# Patient Record
Sex: Female | Born: 1965 | Race: White | Hispanic: No | Marital: Single | Smoking: Never smoker
Health system: Southern US, Community
[De-identification: ages and names within clinical notes are randomized; demographics above are authoritative.]

## PROBLEM LIST (undated history)

## (undated) DIAGNOSIS — E785 Hyperlipidemia, unspecified: Secondary | ICD-10-CM

## (undated) DIAGNOSIS — F329 Major depressive disorder, single episode, unspecified: Secondary | ICD-10-CM

## (undated) DIAGNOSIS — F32A Depression, unspecified: Secondary | ICD-10-CM

## (undated) HISTORY — DX: Major depressive disorder, single episode, unspecified: F32.9

## (undated) HISTORY — DX: Hyperlipidemia, unspecified: E78.5

## (undated) HISTORY — DX: Depression, unspecified: F32.A

---

## 2006-08-26 ENCOUNTER — Ambulatory Visit: Payer: Self-pay | Admitting: Family Medicine

## 2014-09-23 ENCOUNTER — Ambulatory Visit: Payer: Self-pay | Admitting: Family Medicine

## 2015-01-18 ENCOUNTER — Telehealth: Payer: Self-pay | Admitting: Family Medicine

## 2015-01-18 NOTE — Telephone Encounter (Signed)
Michaela Brown from [facility] is requesting a copy of the authorization with the start date.please fax to [phone] if you have any questions you could reach her at [phone]. Her appointment is scheduled for 01-24-15 @ 11a

## 2015-01-20 NOTE — Telephone Encounter (Signed)
Spoke with Marlowe Kays from Dr. Blane Ohara office and gave her the [facility] referral number [phone]) for patient's upcoming appointment.

## 2015-01-24 DIAGNOSIS — M129 Arthropathy, unspecified: Secondary | ICD-10-CM | POA: Insufficient documentation

## 2015-01-24 DIAGNOSIS — K529 Noninfective gastroenteritis and colitis, unspecified: Secondary | ICD-10-CM | POA: Insufficient documentation

## 2015-06-23 ENCOUNTER — Encounter: Payer: Self-pay | Admitting: Family Medicine

## 2015-06-23 ENCOUNTER — Ambulatory Visit (INDEPENDENT_AMBULATORY_CARE_PROVIDER_SITE_OTHER): Payer: 59 | Admitting: Family Medicine

## 2015-06-23 VITALS — BP 102/66 | HR 88 | Temp 98.2°F | Resp 16 | Ht 62.0 in | Wt 173.5 lb

## 2015-06-23 DIAGNOSIS — M25562 Pain in left knee: Secondary | ICD-10-CM | POA: Diagnosis not present

## 2015-06-23 DIAGNOSIS — F418 Other specified anxiety disorders: Principal | ICD-10-CM

## 2015-06-23 DIAGNOSIS — M25561 Pain in right knee: Secondary | ICD-10-CM

## 2015-06-23 DIAGNOSIS — F3341 Major depressive disorder, recurrent, in partial remission: Secondary | ICD-10-CM

## 2015-06-23 DIAGNOSIS — F329 Major depressive disorder, single episode, unspecified: Secondary | ICD-10-CM | POA: Insufficient documentation

## 2015-06-23 DIAGNOSIS — R4586 Emotional lability: Secondary | ICD-10-CM | POA: Insufficient documentation

## 2015-06-23 DIAGNOSIS — R5383 Other fatigue: Secondary | ICD-10-CM | POA: Insufficient documentation

## 2015-06-23 DIAGNOSIS — E785 Hyperlipidemia, unspecified: Secondary | ICD-10-CM | POA: Insufficient documentation

## 2015-06-23 DIAGNOSIS — K640 First degree hemorrhoids: Secondary | ICD-10-CM | POA: Insufficient documentation

## 2015-06-23 DIAGNOSIS — M2550 Pain in unspecified joint: Secondary | ICD-10-CM | POA: Insufficient documentation

## 2015-06-23 MED ORDER — CITALOPRAM HYDROBROMIDE 10 MG PO TABS: 10.0000 mg | ORAL_TABLET | Freq: Every day | ORAL | Status: DC

## 2015-06-23 MED ORDER — CLONAZEPAM 0.5 MG PO TABS: 0.5000 mg | ORAL_TABLET | Freq: Two times a day (BID) | ORAL | Status: DC | PRN

## 2015-06-23 NOTE — Progress Notes (Signed)
Name: Michaela Brown   MRN: [id number]    DOB: 06-20-1966   Date:06/23/2015       Progress Note  Subjective  Chief Complaint  Chief Complaint  Patient presents with  . Advice Only  . Anxiety  . Depression    HPI  Michaela Brown is a 49 year old female here today to discuss her caretaker role for her brother with Neuroferritinopathy. He has been living with her for just about 1 year now and it has become overwhelming at times. 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 She has brought forms today. She reports concerns that he is often found in difficult situations around the home that could be potentially dangerous. Examples include trying to turn on the stove, even though he does not prepare meals. Calling his emergency life alert and when asked why he says "I do not know." He does not have dementia but does have lapses in immediate memory it seems. He is not violent or combative. He can bathe, feed, dress himself with assistance. Still has trouble with gait but holds onto things and walks.   Otherwise she reports mood changes in herself. Patient complains of depression and anxiety. She complains of excessive worrying, depressed mood, difficulty concentrating, fatigue and psychomotor agitation. Onset was approximately several months ago, stable since that time.  She denies current suicidal and homicidal plan or intent.   Family history significant for depression. Possible organic causes contributing are: none.  Risk factors: none Previous treatment includes none. Daci states she hides it well. When she is performing with her dogs at shows and events she reports having to be happy but when done she reports feeling low. She is dating a younger man, feels self conscious about her weight.   Previously she had generalized joint pain and extreme fatigue. Initially thought to be post lyme syndrome,  consulted with ID, this diagnosis ruled out. The specialist suggests she may have early manifestations of an autoimmune disorder. Today she reports all of her symptoms have resolved. Back to baseline management intermittent knee pain.   Past Medical History  Diagnosis Date  . Hyperlipidemia   . Depression     Patient Active Problem List   Diagnosis Date Noted  . Multiple joint pain 06/23/2015  . Bilateral knee pain 06/23/2015  . Changeable mood ([place]) 06/23/2015  . Fatigue 06/23/2015  . Hyperlipidemia LDL goal <100 06/23/2015  . First degree hemorrhoids 06/23/2015  . Arthropathia 01/24/2015    Social History  Substance Use Topics  . Smoking status: Never Smoker   . Smokeless tobacco: Not on file  . Alcohol Use: 0.0 oz/week    0 Standard drinks or equivalent per week     Comment: rarely     Current outpatient prescriptions:  Marland Kitchen  Multiple Vitamins-Minerals (WOMENS MULTIVITAMIN PLUS PO), Take by mouth., Disp: , Rfl:  .  vitamin C (ASCORBIC ACID) 500 MG tablet, Take 500 mg by mouth daily., Disp: , Rfl:   History reviewed. No pertinent past surgical history.  Family History  Problem Relation Age of Onset  . Neurologic Disorder Brother   . Depression Brother     Allergies  Allergen Reactions  . Azithromycin Tinitus     Review of Systems  CONSTITUTIONAL: No significant weight changes, fever, chills, weakness or fatigue.  CARDIOVASCULAR: No chest pain, chest pressure or chest discomfort. No palpitations or edema.  RESPIRATORY: No shortness of breath, cough or sputum.  NEUROLOGICAL: No headache, dizziness, syncope, paralysis, ataxia, numbness or tingling in the extremities. No memory changes. No change in bowel or bladder control.  MUSCULOSKELETAL: Chronic joint pain. No muscle pain. HEMATOLOGIC: No anemia, bleeding or bruising.  LYMPHATICS: No enlarged lymph nodes.  PSYCHIATRIC: Yes change in mood. No change in sleep pattern.  ENDOCRINOLOGIC: No reports of sweating,  cold or heat intolerance. No polyuria or polydipsia.   Depression screen [facility] 2/9 06/23/2015  Decreased Interest 1  Down, Depressed, Hopeless 3  PHQ - 2 Score 4  Altered sleeping 3  Tired, decreased energy 3  Change in appetite 0  Feeling bad or failure about yourself  3  Trouble concentrating 3  Moving slowly or fidgety/restless 0  Suicidal thoughts 1  PHQ-9 Score 17      Objective  Pulse 88  Temp(Src) 98.2 F (36.8 C) (Oral)  Resp 16  Ht 5\' 2"  (1.575 m)  Wt 173 lb 8 oz (78.699 kg)  BMI 31.73 kg/m2  SpO2 98% Body mass index is 31.73 kg/(m^2).  Physical Exam  Constitutional: Patient appears well-developed and well-nourished. In no distress.  Cardiovascular: Normal rate, regular rhythm and normal heart sounds.  No murmur heard.  Pulmonary/Chest: Effort normal and breath sounds normal. No respiratory distress. Musculoskeletal: Normal range of motion bilateral UE and LE, no joint effusions. Peripheral vascular: Bilateral LE no edema. Neurological: CN II-XII grossly intact with no focal deficits. Alert and oriented to person, place, and time. Coordination, balance, strength, speech and gait are normal.  Skin: Skin is warm and dry. No rash noted. No erythema.  Psychiatric: Patient has a tearful mood and affect. Behavior is normal in office today. Judgment and thought content normal in office today.   Assessment & Plan  1. Major depressive disorder, recurrent episode, in partial remission with anxious distress ([place]) New diagnosis. Discussed treatment options. Due to nature of symptoms with some mixed anxiety components will start with Celexa low dose with prn use of benzodiazepine. The patient has been counseled on the proper use, side effects and potential interactions of the new medication. Patient encouraged to review the side effects and safety profile pamphlet provided with the prescription from the pharmacy as well as request counseling from the pharmacy team as needed.    - citalopram (CELEXA) 10 MG tablet; Take 1 tablet (10 mg total) by mouth daily.  Dispense: 30 tablet; Refill: 2 - clonazePAM (KLONOPIN) 0.5 MG tablet; Take 1 tablet (0.5 mg total) by mouth 2 (two) times daily as needed for anxiety.  Dispense: 30 tablet; Refill: 2  2. Bilateral knee pain Stable.

## 2015-09-28 ENCOUNTER — Other Ambulatory Visit: Payer: Self-pay | Admitting: Family Medicine

## 2015-11-12 IMAGING — CR DG KNEE COMPLETE 4+V*R*
1 series · 4 of 4 positions shown · non-contrast
Comparison: None.

CLINICAL DATA: Right knee pain, no known injury, initial encounter

EXAM:
RIGHT KNEE - COMPLETE 4+ VIEW

[Series 1: kdxr knee rt comp with obliques · 0.14mm/px · 4 of 4 slices shown]
[im 1/4]
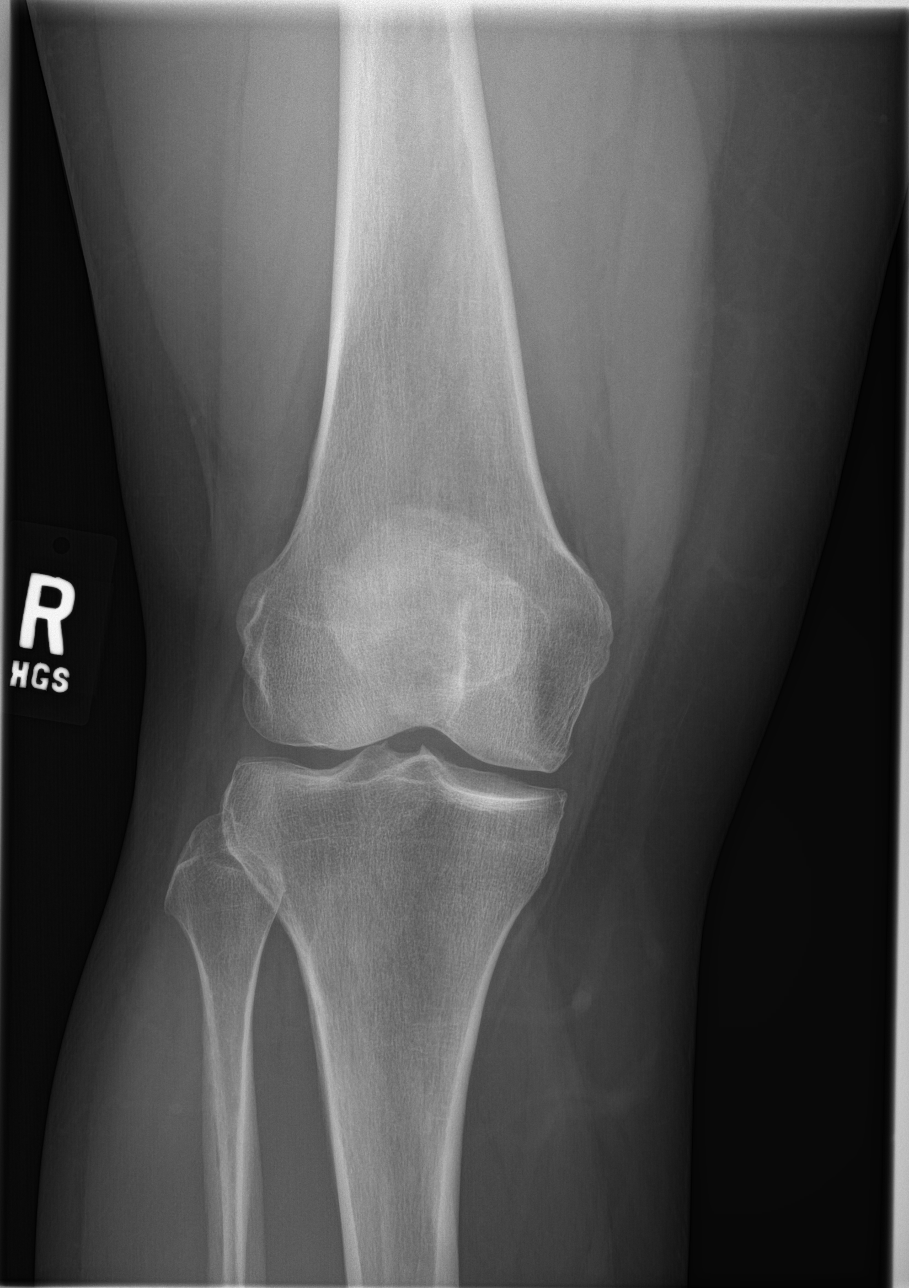
[im 2/4]
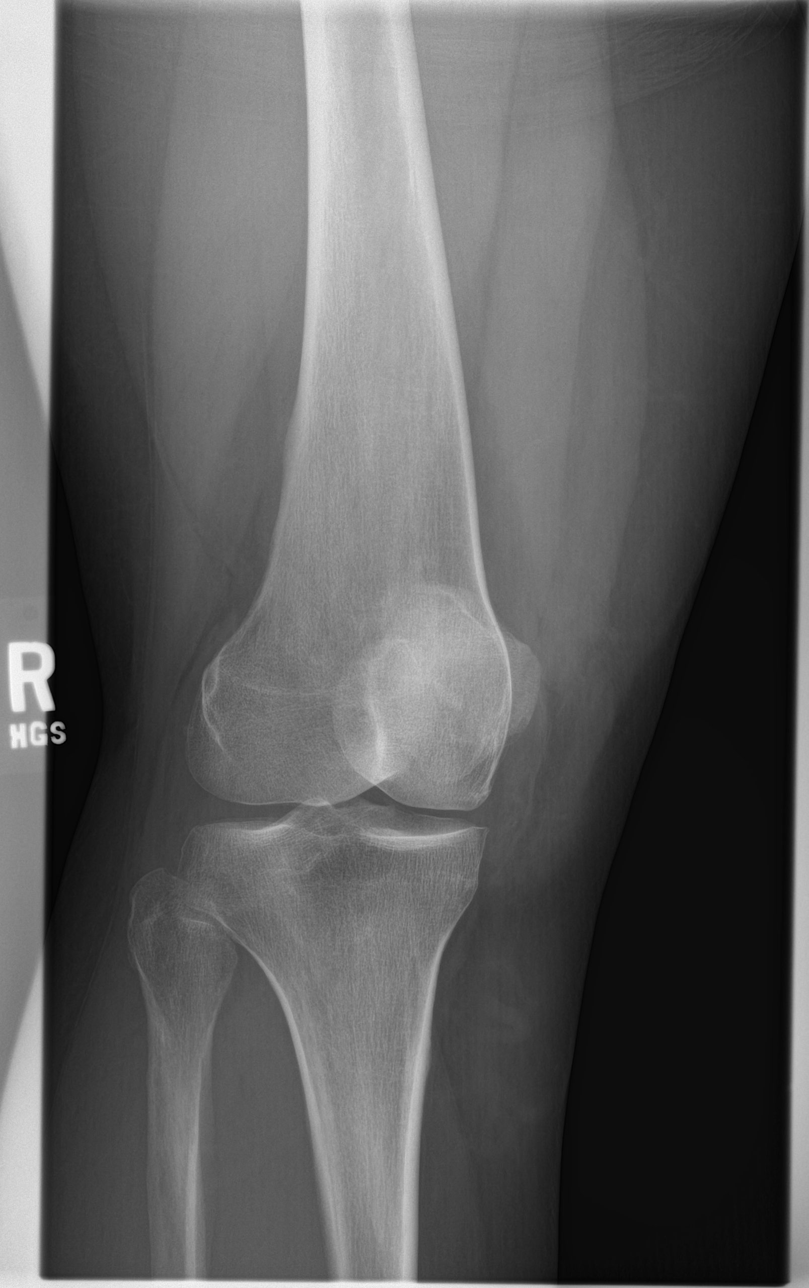
[im 3/4]
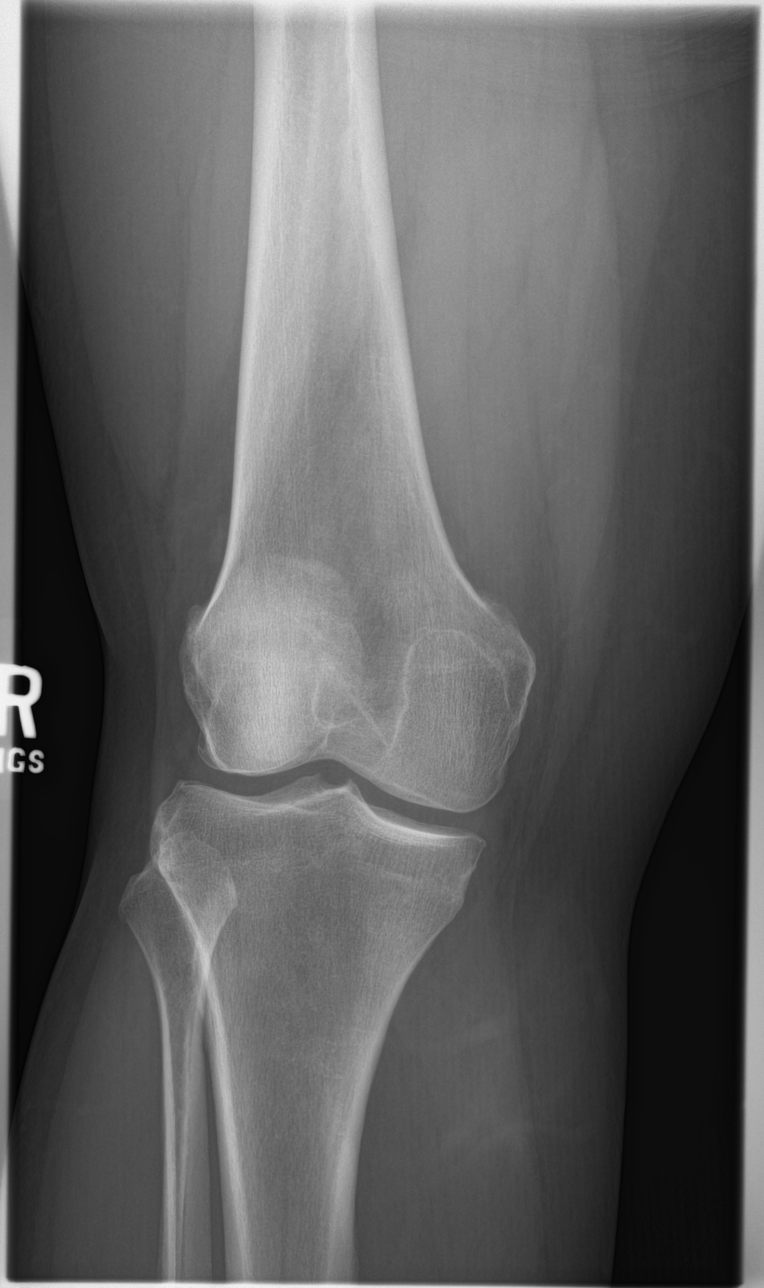
[im 4/4]
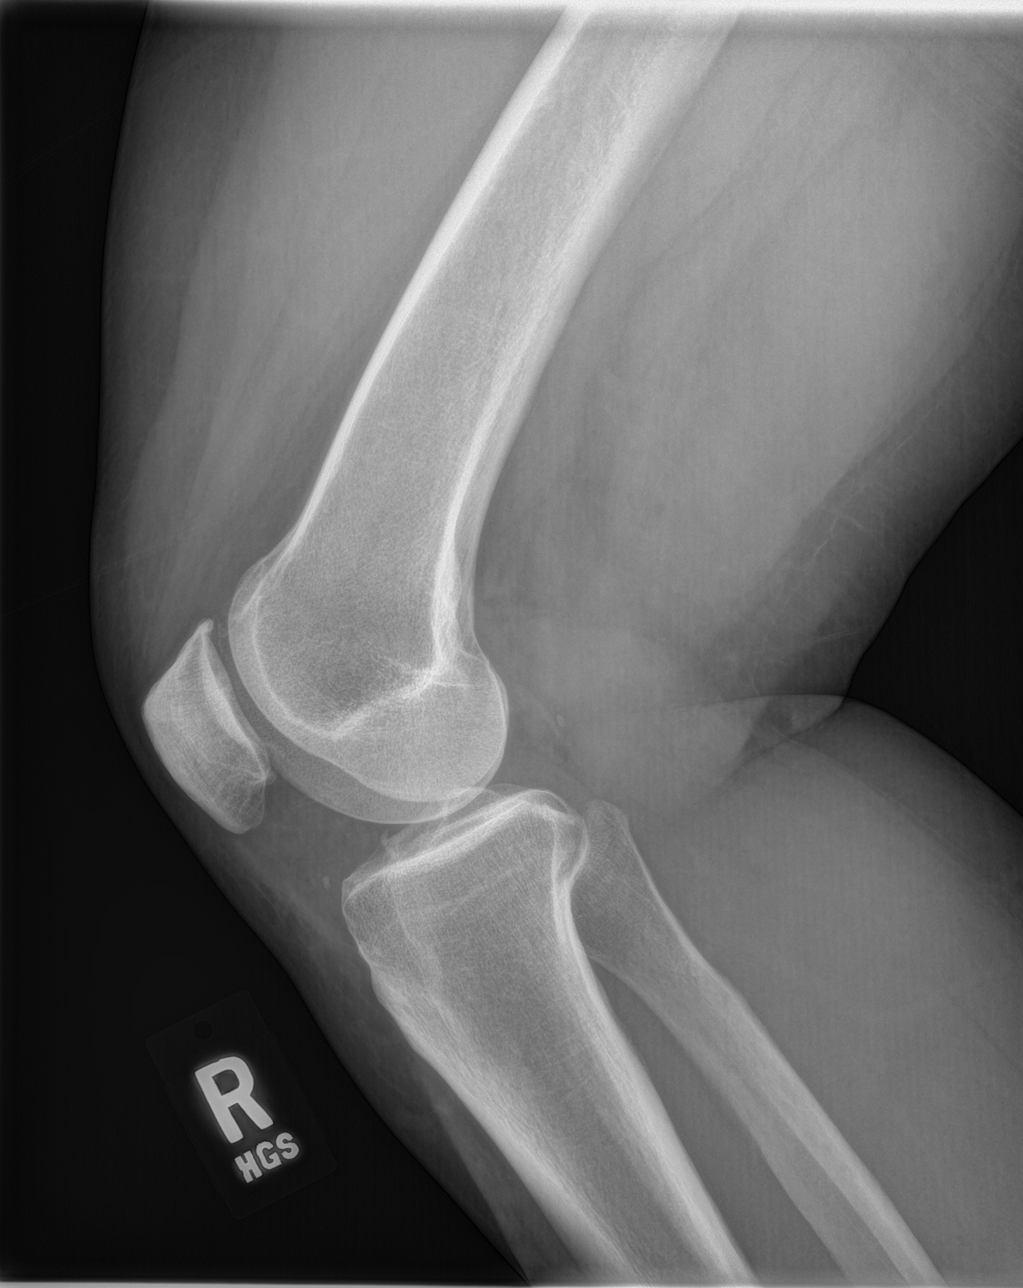

[4 of 4 positions shown; findings below may reference images not displayed]

FINDINGS: Mild osteophytic changes are noted medially and laterally. No
significant joint effusion is seen. No significant joint space
narrowing is seen. No acute fracture or dislocation is noted.
IMPRESSION: Mild osteophytic changes.  No acute abnormality is noted.

## 2015-12-25 ENCOUNTER — Encounter: Payer: Self-pay | Admitting: Family Medicine

## 2015-12-25 ENCOUNTER — Ambulatory Visit (INDEPENDENT_AMBULATORY_CARE_PROVIDER_SITE_OTHER): Payer: BLUE CROSS/BLUE SHIELD | Admitting: Family Medicine

## 2015-12-25 VITALS — BP 122/62 | Temp 98.3°F | Resp 16 | Wt 179.0 lb

## 2015-12-25 DIAGNOSIS — E785 Hyperlipidemia, unspecified: Secondary | ICD-10-CM

## 2015-12-25 DIAGNOSIS — F329 Major depressive disorder, single episode, unspecified: Secondary | ICD-10-CM

## 2015-12-25 DIAGNOSIS — R5383 Other fatigue: Secondary | ICD-10-CM

## 2015-12-25 DIAGNOSIS — R635 Abnormal weight gain: Secondary | ICD-10-CM | POA: Diagnosis not present

## 2015-12-25 DIAGNOSIS — K921 Melena: Secondary | ICD-10-CM | POA: Diagnosis not present

## 2015-12-25 DIAGNOSIS — Z0000 Encounter for general adult medical examination without abnormal findings: Secondary | ICD-10-CM

## 2015-12-25 DIAGNOSIS — Z113 Encounter for screening for infections with a predominantly sexual mode of transmission: Secondary | ICD-10-CM

## 2015-12-25 DIAGNOSIS — IMO0001 Reserved for inherently not codable concepts without codable children: Secondary | ICD-10-CM

## 2015-12-25 DIAGNOSIS — N950 Postmenopausal bleeding: Secondary | ICD-10-CM

## 2015-12-25 DIAGNOSIS — Z1239 Encounter for other screening for malignant neoplasm of breast: Secondary | ICD-10-CM

## 2015-12-25 DIAGNOSIS — N930 Postcoital and contact bleeding: Secondary | ICD-10-CM

## 2015-12-25 MED ORDER — FLUOXETINE HCL 20 MG PO CAPS: 20.0000 mg | ORAL_CAPSULE | Freq: Every day | ORAL | Status: DC

## 2015-12-25 NOTE — Patient Instructions (Addendum)
I've put in a referral for you to see the GI doctor for a colonoscopy Please do call to schedule your mammogram; the number to schedule one at either [facility] or [facility] is [phone] We'll get labs today If you have not heard anything from my staff in a week about any orders/referrals/studies from today, please contact us here to follow-up ([phone]  Health Maintenance, Female Adopting a healthy lifestyle and getting preventive care can go a long way to promote health and wellness. Talk with your health care provider about what schedule of regular examinations is right for you. This is a good chance for you to check in with your provider about disease prevention and staying healthy. In between checkups, there are plenty of things you can do on your own. Experts have done a lot of research about which lifestyle changes and preventive measures are most likely to keep you healthy. Ask your health care provider for more information. WEIGHT AND DIET  Eat a healthy diet  Be sure to include plenty of vegetables, fruits, low-fat dairy products, and lean protein.  Do not eat a lot of foods high in solid fats, added sugars, or salt.  Get regular exercise. This is one of the most important things you can do for your health.  Most adults should exercise for at least 150 minutes each week. The exercise should increase your heart rate and make you sweat (moderate-intensity exercise).  Most adults should also do strengthening exercises at least twice a week. This is in addition to the moderate-intensity exercise.  Maintain a healthy weight  Body mass index (BMI) is a measurement that can be used to identify possible weight problems. It estimates body fat based on height and weight. Your health care provider can help determine your BMI and help you achieve or maintain a healthy weight.  For females 39 years of age and older:   A BMI below 18.5 is considered  underweight.  A BMI of 18.5 to 24.9 is normal.  A BMI of 25 to 29.9 is considered overweight.  A BMI of 30 and above is considered obese.  Watch levels of cholesterol and blood lipids  You should start having your blood tested for lipids and cholesterol at 48 years of age, then have this test every 5 years.  You may need to have your cholesterol levels checked more often if:  Your lipid or cholesterol levels are high.  You are older than 48 years of age.  You are at high risk for heart disease.  CANCER SCREENING   Lung Cancer  Lung cancer screening is recommended for adults 41-97 years old who are at high risk for lung cancer because of a history of smoking.  A yearly low-dose CT scan of the lungs is recommended for people who:  Currently smoke.  Have quit within the past 15 years.  Have at least a 30-pack-year history of smoking. A pack year is smoking an average of one pack of cigarettes a day for 1 year.  Yearly screening should continue until it has been 15 years since you quit.  Yearly screening should stop if you develop a health problem that would prevent you from having lung cancer treatment.  Breast Cancer  Practice breast self-awareness. This means understanding how your breasts normally appear and feel.  It also means doing regular breast self-exams. Let your health care provider know about any changes, no matter how small.  If you are in your  16s or 82s, you should have a clinical breast exam (CBE) by a health care provider every 1-3 years as part of a regular health exam.  If you are 66 or older, have a CBE every year. Also consider having a breast X-ray (mammogram) every year.  If you have a family history of breast cancer, talk to your health care provider about genetic screening.  If you are at high risk for breast cancer, talk to your health care provider about having an MRI and a mammogram every year.  Breast cancer gene (BRCA) assessment is  recommended for women who have family members with BRCA-related cancers. BRCA-related cancers include:  Breast.  Ovarian.  Tubal.  Peritoneal cancers.  Results of the assessment will determine the need for genetic counseling and BRCA1 and BRCA2 testing. Cervical Cancer Your health care provider may recommend that you be screened regularly for cancer of the pelvic organs (ovaries, uterus, and vagina). This screening involves a pelvic examination, including checking for microscopic changes to the surface of your cervix (Pap test). You may be encouraged to have this screening done every 3 years, beginning at age 27.  For women ages 35-65, health care providers may recommend pelvic exams and Pap testing every 3 years, or they may recommend the Pap and pelvic exam, combined with testing for human papilloma virus (HPV), every 5 years. Some types of HPV increase your risk of cervical cancer. Testing for HPV may also be done on women of any age with unclear Pap test results.  Other health care providers may not recommend any screening for nonpregnant women who are considered low risk for pelvic cancer and who do not have symptoms. Ask your health care provider if a screening pelvic exam is right for you.  If you have had past treatment for cervical cancer or a condition that could lead to cancer, you need Pap tests and screening for cancer for at least 20 years after your treatment. If Pap tests have been discontinued, your risk factors (such as having a new sexual partner) need to be reassessed to determine if screening should resume. Some women have medical problems that increase the chance of getting cervical cancer. In these cases, your health care provider may recommend more frequent screening and Pap tests. Colorectal Cancer  This type of cancer can be detected and often prevented.  Routine colorectal cancer screening usually begins at 48 years of age and continues through 48 years of  age.  Your health care provider may recommend screening at an earlier age if you have risk factors for colon cancer.  Your health care provider may also recommend using home test kits to check for hidden blood in the stool.  A small camera at the end of a tube can be used to examine your colon directly (sigmoidoscopy or colonoscopy). This is done to check for the earliest forms of colorectal cancer.  Routine screening usually begins at age 75.  Direct examination of the colon should be repeated every 5-10 years through 48 years of age. However, you may need to be screened more often if early forms of precancerous polyps or small growths are found. Skin Cancer  Check your skin from head to toe regularly.  Tell your health care provider about any new moles or changes in moles, especially if there is a change in a mole's shape or color.  Also tell your health care provider if you have a mole that is larger than the size of a pencil eraser.  Always use sunscreen. Apply sunscreen liberally and repeatedly throughout the day.  Protect yourself by wearing long sleeves, pants, a wide-brimmed hat, and sunglasses whenever you are outside. HEART DISEASE, DIABETES, AND HIGH BLOOD PRESSURE   High blood pressure causes heart disease and increases the risk of stroke. High blood pressure is more likely to develop in:  People who have blood pressure in the high end of the normal range (130-139/85-89 mm Hg).  People who are overweight or obese.  People who are African American.  If you are 73-14 years of age, have your blood pressure checked every 3-5 years. If you are 32 years of age or older, have your blood pressure checked every year. You should have your blood pressure measured twice--once when you are at a hospital or clinic, and once when you are not at a hospital or clinic. Record the average of the two measurements. To check your blood pressure when you are not at a hospital or clinic, you can  use:  An automated blood pressure machine at a pharmacy.  A home blood pressure monitor.  If you are between 20 years and 38 years old, ask your health care provider if you should take aspirin to prevent strokes.  Have regular diabetes screenings. This involves taking a blood sample to check your fasting blood sugar level.  If you are at a normal weight and have a low risk for diabetes, have this test once every three years after 48 years of age.  If you are overweight and have a high risk for diabetes, consider being tested at a younger age or more often. PREVENTING INFECTION  Hepatitis B  If you have a higher risk for hepatitis B, you should be screened for this virus. You are considered at high risk for hepatitis B if:  You were born in a country where hepatitis B is common. Ask your health care provider which countries are considered high risk.  Your parents were born in a high-risk country, and you have not been immunized against hepatitis B (hepatitis B vaccine).  You have HIV or AIDS.  You use needles to inject street drugs.  You live with someone who has hepatitis B.  You have had sex with someone who has hepatitis B.  You get hemodialysis treatment.  You take certain medicines for conditions, including cancer, organ transplantation, and autoimmune conditions. Hepatitis C  Blood testing is recommended for:  Everyone born from [birth year] through 1963.  Anyone with known risk factors for hepatitis C. Sexually transmitted infections (STIs)  You should be screened for sexually transmitted infections (STIs) including gonorrhea and chlamydia if:  You are sexually active and are younger than 48 years of age.  You are older than 48 years of age and your health care provider tells you that you are at risk for this type of infection.  Your sexual activity has changed since you were last screened and you are at an increased risk for chlamydia or gonorrhea. Ask your health care  provider if you are at risk.  If you do not have HIV, but are at risk, it may be recommended that you take a prescription medicine daily to prevent HIV infection. This is called pre-exposure prophylaxis (PrEP). You are considered at risk if:  You are sexually active and do not regularly use condoms or know the HIV status of your partner(s).  You take drugs by injection.  You are sexually active with a partner who has HIV. Talk with your health  care provider about whether you are at high risk of being infected with HIV. If you choose to begin PrEP, you should first be tested for HIV. You should then be tested every 3 months for as long as you are taking PrEP.  PREGNANCY   If you are premenopausal and you may become pregnant, ask your health care provider about preconception counseling.  If you may become pregnant, take 400 to 800 micrograms (mcg) of folic acid every day.  If you want to prevent pregnancy, talk to your health care provider about birth control (contraception). OSTEOPOROSIS AND MENOPAUSE   Osteoporosis is a disease in which the bones lose minerals and strength with aging. This can result in serious bone fractures. Your risk for osteoporosis can be identified using a bone density scan.  If you are 32 years of age or older, or if you are at risk for osteoporosis and fractures, ask your health care provider if you should be screened.  Ask your health care provider whether you should take a calcium or vitamin D supplement to lower your risk for osteoporosis.  Menopause may have certain physical symptoms and risks.  Hormone replacement therapy may reduce some of these symptoms and risks. Talk to your health care provider about whether hormone replacement therapy is right for you.  HOME CARE INSTRUCTIONS   Schedule regular health, dental, and eye exams.  Stay current with your immunizations.   Do not use any tobacco products including cigarettes, chewing tobacco, or  electronic cigarettes.  If you are pregnant, do not drink alcohol.  If you are breastfeeding, limit how much and how often you drink alcohol.  Limit alcohol intake to no more than 1 drink per day for nonpregnant women. One drink equals 12 ounces of beer, 5 ounces of wine, or 1 ounces of hard liquor.  Do not use street drugs.  Do not share needles.  Ask your health care provider for help if you need support or information about quitting drugs.  Tell your health care provider if you often feel depressed.  Tell your health care provider if you have ever been abused or do not feel safe at home.   This information is not intended to replace advice given to you by your health care provider. Make sure you discuss any questions you have with your health care provider.   Document Released: 01/21/2011 Document Revised: 07/29/2014 Document Reviewed: 06/09/2013 Elsevier Interactive Patient Education [employer].

## 2015-12-25 NOTE — Progress Notes (Signed)
BP 122/62 mmHg  Temp(Src) 98.3 F (36.8 C) (Oral)  Resp 16  Wt 179 lb (81.194 kg)  SpO2 95%   Subjective:    Patient ID: Michaela Brown, female    DOB: [birth year], 50 y.o.   MRN: [id number]  HPI: Michaela Brown is a 50 y.o. female  Chief Complaint  Patient presents with  . Annual Exam  . eye redness   Patient is new to me; here today for a complete physical and with some other issues  Luz Lex a lot; overwhelmed with caring for family member On citalopram, on it since December 2016; five pounds of weight gain; nonstop going, not like inactivity Not typical for her; no thyroid disease personally, but sister has menopausal symptoms and gaining weight, 120 to 150 pounds, borderline thyroid; she has taken maybe five pills of clonazepam since December  Done with periods around age 57 or 34; done completely since 2013; no children; mother went through menopause at age 55  Sick last year; joint pain, couldn't even walk; doctor was perplexed; saw rheum, ID, ortho; bilateral knee effusions; better now  USPSTF grade A and B recommendations Alcohol: 2-3x a week, beer Depression: stop citalopram, start prozac Hypertension: controlled Obesity: check thyroid Tobacco use: no HIV, hep B, hep C: HIV, Hep B STD testing and prevention (chl/gon/syphilis): treat Lipids: today Glucose: today Colorectal cancer: refer  Breast cancer: due for mammo BRCA gene screening: no ovarian cancer, but mother died at age 34 Intimate partner violence: no Cervical cancer screening: normal in last 3 years Lung cancer: n/a Osteoporosis: love milk; used depo for 1 year Fall prevention/vitamin D: not taking vit D, active AAA: n/a Aspirin: n/a Diet: 5 servings of fruits and veggies a day Exercise: active Skin cancer: right flank   Depression screen [facility] 2/9 12/25/2015 06/23/2015  Decreased Interest 0 1  Down, Depressed, Hopeless 1 3  PHQ - 2 Score 1 4  Altered sleeping - 3  Tired, decreased energy -  3  Change in appetite - 0  Feeling bad or failure about yourself  - 3  Trouble concentrating - 3  Moving slowly or fidgety/restless - 0  Suicidal thoughts - 1  PHQ-9 Score - 17   Relevant past medical, surgical, family and social history reviewed Past Medical History  Diagnosis Date  . Hyperlipidemia   . Depression   MD notes: hx of lyme disease; menopausal  History reviewed. No pertinent past surgical history.   Family History  Problem Relation Age of Onset  . Neurologic Disorder Brother   . Depression Brother   MD note: brother has neuroferritinopathy  Social History  Substance Use Topics  . Smoking status: Never Smoker   . Smokeless tobacco: None  . Alcohol Use: 0.0 oz/week    0 Standard drinks or equivalent per week     Comment: rarely   Interim medical history since last visit reviewed. Allergies and medications reviewed  Review of Systems  Gastrointestinal:       Internal hemorrhoids, bloody stools the last few weeks   Per HPI unless specifically indicated above     Objective:    BP 122/62 mmHg  Temp(Src) 98.3 F (36.8 C) (Oral)  Resp 16  Wt 179 lb (81.194 kg)  SpO2 95%  Wt Readings from Last 3 Encounters:  01/29/16 180 lb (81.647 kg)  12/25/15 179 lb (81.194 kg)  06/23/15 173 lb 8 oz (78.699 kg)    Physical Exam  Constitutional: She appears well-developed and well-nourished.  HENT:  Head: Normocephalic and atraumatic.  Eyes: Conjunctivae and EOM are normal. Right eye exhibits no hordeolum. Left eye exhibits no hordeolum. No scleral icterus.  Neck: Carotid bruit is not present. No thyromegaly present.  Cardiovascular: Normal rate, regular rhythm, S1 normal, S2 normal and normal heart sounds.   No extrasystoles are present.  Pulmonary/Chest: Effort normal and breath sounds normal. No respiratory distress. Right breast exhibits no inverted nipple, no mass, no nipple discharge, no skin change and no tenderness. Left breast exhibits no inverted nipple,  no mass, no nipple discharge, no skin change and no tenderness. Breasts are symmetrical.  Abdominal: Soft. Normal appearance and bowel sounds are normal. She exhibits no distension, no abdominal bruit, no pulsatile midline mass and no mass. There is no hepatosplenomegaly. There is no tenderness. No hernia.  Genitourinary: Uterus normal. Pelvic exam was performed with patient prone. There is no rash or lesion on the right labia. There is no rash or lesion on the left labia. Cervix exhibits no motion tenderness. Right adnexum displays no mass, no tenderness and no fullness. Left adnexum displays no mass, no tenderness and no fullness. Vaginal discharge found.  Musculoskeletal: Normal range of motion. She exhibits no edema.  Lymphadenopathy:       Head (right side): No submandibular adenopathy present.       Head (left side): No submandibular adenopathy present.    She has no cervical adenopathy.    She has no axillary adenopathy.  Neurological: She is alert. She displays no tremor. No cranial nerve deficit. She exhibits normal muscle tone. Gait normal.  Skin: Skin is warm and dry. No bruising and no ecchymosis noted. No cyanosis. No pallor.  Psychiatric: Her speech is normal and behavior is normal. Thought content normal. Her mood appears not anxious. She does not exhibit a depressed mood.      Assessment & Plan:   Problem List Items Addressed This Visit      Other   Preventative health care - Primary    USPSTF grade A and B recommendations reviewed with patient; age-appropriate recommendations, preventive care, screening tests, etc discussed and encouraged; healthy living encouraged; see AVS for patient education given to patient      Relevant Orders   CBC with Differential/Platelet (Completed)   Comprehensive metabolic panel (Completed)   Lipid Panel w/o Chol/HDL Ratio (Completed)   TSH (Completed)   Postmenopausal postcoital bleeding    Ordering US      Relevant Orders   US  Transvaginal Non-OB   US Pelvis Complete   Major depressive disorder, single episode with anxious distress    Supportive listening; start SSRI; close f/u; call before next visit if any dark thoughts      Hyperlipidemia LDL goal <100    Check lipid panel      Fatigue   Relevant Orders   ANA w/Reflex if Positive (Completed)   VITAMIN D 25 Hydroxy (Vit-D Deficiency, Fractures) (Completed)   Vitamin B12 (Completed)   Blood stool    Reported to be hemorrhoids; would prefer patient see GI given her age (late 40s)      Relevant Orders   Ambulatory referral to Gastroenterology    Other Visit Diagnoses    Weight gain        Relevant Orders    T4, free (Completed)    Breast cancer screening        Relevant Orders    DG Chest 2 View    Screen for STD (sexually transmitted disease)          Relevant Orders    HIV antibody    Hepatitis panel, acute    GC/Chlamydia Probe Amp        Follow up plan: Return in about 3 weeks (around 01/15/2016) for med check; RTC 1 year for physical.  An after-visit summary was printed and given to the patient at [place].  Please see the patient instructions which may contain other information and recommendations beyond what is mentioned above in the assessment and plan.  Meds ordered this encounter  Medications  . DISCONTD: FLUoxetine (PROZAC) 20 MG capsule    Sig: Take 1 capsule (20 mg total) by mouth daily. (this replaces citalopram)    Dispense:  30 capsule    Refill:  0    Stop the citalopram    Orders Placed This Encounter  Procedures  . GC/Chlamydia Probe Amp  . GC/Chlamydia Probe Amp  . DG Chest 2 View  . US Transvaginal Non-OB  . US Pelvis Complete  . CBC with Differential/Platelet  . ANA w/Reflex if Positive  . Comprehensive metabolic panel  . Lipid Panel w/o Chol/HDL Ratio  . T4, free  . TSH  . VITAMIN D 25 Hydroxy (Vit-D Deficiency, Fractures)  . Vitamin B12  . HIV antibody  . Hepatitis panel, acute  . HIV antibody  .  Ambulatory referral to Gastroenterology

## 2015-12-26 ENCOUNTER — Telehealth: Payer: Self-pay | Admitting: Family Medicine

## 2015-12-26 LAB — COMPREHENSIVE METABOLIC PANEL
A/G RATIO: 1.5 (ref 1.2–2.2)
ALBUMIN: 4.3 g/dL (ref 3.5–5.5)
ALT: 16 IU/L (ref 0–32)
AST: 19 IU/L (ref 0–40)
Alkaline Phosphatase: 96 IU/L (ref 39–117)
BILIRUBIN TOTAL: 0.3 mg/dL (ref 0.0–1.2)
BUN / CREAT RATIO: 19 (ref 9–23)
BUN: 14 mg/dL (ref 6–24)
CHLORIDE: 102 mmol/L (ref 96–106)
CO2: 20 mmol/L (ref 18–29)
Calcium: 10 mg/dL (ref 8.7–10.2)
Creatinine, Ser: 0.73 mg/dL (ref 0.57–1.00)
GFR calc non Af Amer: 98 mL/min/{1.73_m2} (ref >59)
GFR, EST AFRICAN AMERICAN: 113 mL/min/{1.73_m2} (ref >59)
GLOBULIN, TOTAL: 2.9 g/dL (ref 1.5–4.5)
Glucose: 123 mg/dL — ABNORMAL HIGH (ref 65–99)
POTASSIUM: 3.9 mmol/L (ref 3.5–5.2)
SODIUM: 142 mmol/L (ref 134–144)
TOTAL PROTEIN: 7.2 g/dL (ref 6.0–8.5)

## 2015-12-26 LAB — CBC WITH DIFFERENTIAL/PLATELET
BASOS ABS: 0 10*3/uL (ref 0.0–0.2)
Basos: 0 %
EOS (ABSOLUTE): 0.2 10*3/uL (ref 0.0–0.4)
Eos: 2 %
HEMOGLOBIN: 14.4 g/dL (ref 11.1–15.9)
Hematocrit: 40.9 % (ref 34.0–46.6)
Immature Grans (Abs): 0 10*3/uL (ref 0.0–0.1)
Immature Granulocytes: 0 %
LYMPHS ABS: 2.6 10*3/uL (ref 0.7–3.1)
Lymphs: 30 %
MCH: 29.4 pg (ref 26.6–33.0)
MCHC: 35.2 g/dL (ref 31.5–35.7)
MCV: 84 fL (ref 79–97)
MONOCYTES: 7 %
Monocytes Absolute: 0.6 10*3/uL (ref 0.1–0.9)
NEUTROS ABS: 5.2 10*3/uL (ref 1.4–7.0)
Neutrophils: 61 %
Platelets: 277 10*3/uL (ref 150–379)
RBC: 4.9 x10E6/uL (ref 3.77–5.28)
RDW: 14.1 % (ref 12.3–15.4)
WBC: 8.5 10*3/uL (ref 3.4–10.8)

## 2015-12-26 LAB — VITAMIN B12: Vitamin B-12: 564 pg/mL (ref 211–946)

## 2015-12-26 LAB — TSH: TSH: 1.04 u[IU]/mL (ref 0.450–4.500)

## 2015-12-26 LAB — LIPID PANEL W/O CHOL/HDL RATIO
Cholesterol, Total: 256 mg/dL — ABNORMAL HIGH (ref 100–199)
HDL: 52 mg/dL (ref >39)
LDL CALC: 142 mg/dL — AB (ref 0–99)
Triglycerides: 311 mg/dL — ABNORMAL HIGH (ref 0–149)
VLDL CHOLESTEROL CAL: 62 mg/dL — AB (ref 5–40)

## 2015-12-26 LAB — VITAMIN D 25 HYDROXY (VIT D DEFICIENCY, FRACTURES): Vit D, 25-Hydroxy: 30.6 ng/mL (ref 30.0–100.0)

## 2015-12-26 LAB — T4, FREE: FREE T4: 1.11 ng/dL (ref 0.82–1.77)

## 2015-12-26 LAB — GC/CHLAMYDIA PROBE AMP
Chlamydia trachomatis, NAA: NEGATIVE
NEISSERIA GONORRHOEAE BY PCR: NEGATIVE

## 2015-12-26 LAB — HIV ANTIBODY (ROUTINE TESTING W REFLEX): HIV Screen 4th Generation wRfx: NONREACTIVE

## 2015-12-26 LAB — ANA W/REFLEX IF POSITIVE: ANA: NEGATIVE

## 2015-12-26 NOTE — Telephone Encounter (Signed)
Patient received call pertaining to referral for ultrasound. States she is not able to do the appointment for 01-01-16 but any other day that week and the following week she is only available for Monday and Tuesday. Please reschedule for patient.

## 2015-12-26 NOTE — Telephone Encounter (Signed)
Pt given phone number to reschedule

## 2016-01-01 ENCOUNTER — Other Ambulatory Visit: Payer: Self-pay

## 2016-01-02 ENCOUNTER — Ambulatory Visit: Payer: BLUE CROSS/BLUE SHIELD | Admitting: Family Medicine

## 2016-01-03 ENCOUNTER — Ambulatory Visit: Payer: BLUE CROSS/BLUE SHIELD

## 2016-01-03 ENCOUNTER — Telehealth: Payer: Self-pay

## 2016-01-03 NOTE — Telephone Encounter (Signed)
Gastroenterology Pre-Procedure Review  Request Date:  Requesting Physician: Dr. Sanda Klein  PATIENT REVIEW QUESTIONS: The patient responded to the following health history questions as indicated:    1. Are you having any GI issues? yes (Rectal bleeding) 2. Do you have a personal history of Polyps? no 3. Do you have a family history of Colon Cancer or Polyps? no 4. Diabetes Mellitus? no 5. Joint replacements in the past 12 months?no 6. Major health problems in the past 3 months?no 7. Any artificial heart valves, MVP, or defibrillator?no    MEDICATIONS & ALLERGIES:    Patient reports the following regarding taking any anticoagulation/antiplatelet therapy:   Plavix, Coumadin, Eliquis, Xarelto, Lovenox, Pradaxa, Brilinta, or Effient? no Aspirin? no  Patient confirms/reports the following medications:  Current Outpatient Prescriptions  Medication Sig Dispense Refill  . cholecalciferol (VITAMIN D) 1000 units tablet Take 1,000 Units by mouth daily.    . clonazePAM (KLONOPIN) 0.5 MG tablet Take 1 tablet (0.5 mg total) by mouth 2 (two) times daily as needed for anxiety. 30 tablet 2  . FLUoxetine (PROZAC) 20 MG capsule Take 1 capsule (20 mg total) by mouth daily. (this replaces citalopram) 30 capsule 0  . Multiple Vitamins-Minerals (WOMENS MULTIVITAMIN PLUS PO) Take by mouth.    . vitamin C (ASCORBIC ACID) 500 MG tablet Take 500 mg by mouth daily.     No current facility-administered medications for this visit.    Patient confirms/reports the following allergies:  Allergies  Allergen Reactions  . Azithromycin Tinitus    No orders of the defined types were placed in this encounter.    AUTHORIZATION INFORMATION Primary Insurance: 1D#: Group #:  Secondary Insurance: 1D#: Group #:  SCHEDULE INFORMATION: Date: Pt will call back with date. Time: Location:

## 2016-01-09 ENCOUNTER — Telehealth: Payer: Self-pay | Admitting: Family Medicine

## 2016-01-09 NOTE — Telephone Encounter (Signed)
Left pt voicemail

## 2016-01-09 NOTE — Telephone Encounter (Signed)
I just wrote a 30 day supply on June 5th; she should not be out yet; please confirm she's taking it correctly; thank you

## 2016-01-10 NOTE — Telephone Encounter (Signed)
Pt says that she has 2 weeks worth left of Prozac. She is going out of town and will not return until mid July. Patient would like to get just enough to last until she return she does not want to run out while she is away

## 2016-01-11 MED ORDER — FLUOXETINE HCL 20 MG PO CAPS: 20.0000 mg | ORAL_CAPSULE | Freq: Every day | ORAL | Status: DC

## 2016-01-11 NOTE — Addendum Note (Signed)
Addended by: LADA, Satira Anis on: 01/11/2016 08:10 AM   Modules accepted: Orders

## 2016-01-11 NOTE — Telephone Encounter (Signed)
rx sent

## 2016-01-29 ENCOUNTER — Encounter: Payer: Self-pay | Admitting: Family Medicine

## 2016-01-29 ENCOUNTER — Ambulatory Visit: Payer: BLUE CROSS/BLUE SHIELD | Admitting: Family Medicine

## 2016-01-29 ENCOUNTER — Ambulatory Visit (INDEPENDENT_AMBULATORY_CARE_PROVIDER_SITE_OTHER): Payer: BLUE CROSS/BLUE SHIELD | Admitting: Family Medicine

## 2016-01-29 VITALS — BP 110/76 | HR 76 | Temp 98.2°F | Resp 14 | Wt 180.0 lb

## 2016-01-29 DIAGNOSIS — E785 Hyperlipidemia, unspecified: Secondary | ICD-10-CM

## 2016-01-29 DIAGNOSIS — Z8711 Personal history of peptic ulcer disease: Secondary | ICD-10-CM

## 2016-01-29 DIAGNOSIS — S39012D Strain of muscle, fascia and tendon of lower back, subsequent encounter: Secondary | ICD-10-CM | POA: Diagnosis not present

## 2016-01-29 DIAGNOSIS — Z8719 Personal history of other diseases of the digestive system: Secondary | ICD-10-CM

## 2016-01-29 DIAGNOSIS — F329 Major depressive disorder, single episode, unspecified: Secondary | ICD-10-CM | POA: Diagnosis not present

## 2016-01-29 MED ORDER — CELECOXIB 100 MG PO CAPS: 100.0000 mg | ORAL_CAPSULE | Freq: Two times a day (BID) | ORAL | Status: DC

## 2016-01-29 MED ORDER — FLUOXETINE HCL 20 MG PO CAPS: 20.0000 mg | ORAL_CAPSULE | Freq: Every day | ORAL | Status: DC

## 2016-01-29 MED ORDER — CYCLOBENZAPRINE HCL 10 MG PO TABS: 5.0000 mg | ORAL_TABLET | Freq: Three times a day (TID) | ORAL | Status: AC | PRN

## 2016-01-29 NOTE — Patient Instructions (Addendum)
vitamin D is bottom of normal range, so add 1,000 iu vitamin D3 once a day  Continue the fluoxetine  Check temperature 3-4 x a day until the redness of your back and abdominal symptoms resolve  Use the muscle relaxers if needed, but don't drive if they make you sleepy  Use the celebrex if needed, no other NSAIDs; okay to take tylenol per package directions  Limit eggs to no more than three yolks per week Try to limit saturated fats in your diet (bologna, hot dogs, barbeque, cheeseburgers, hamburgers, steak, bacon, sausage, cheese, etc.) and get more fresh fruits, vegetables, and whole grains   Back Exercises The following exercises strengthen the muscles that help to support the back. They also help to keep the lower back flexible. Doing these exercises can help to prevent back pain or lessen existing pain. If you have back pain or discomfort, try doing these exercises 2-3 times each day or as told by your health care provider. When the pain goes away, do them once each day, but increase the number of times that you repeat the steps for each exercise (do more repetitions). If you do not have back pain or discomfort, do these exercises once each day or as told by your health care provider. EXERCISES Single Knee to Chest Repeat these steps 3-5 times for each leg: 1. Lie on your back on a firm bed or the floor with your legs extended. 2. Bring one knee to your chest. Your other leg should stay extended and in contact with the floor. 3. Hold your knee in place by grabbing your knee or thigh. 4. Pull on your knee until you feel a gentle stretch in your lower back. 5. Hold the stretch for 10-30 seconds. 6. Slowly release and straighten your leg. Pelvic Tilt Repeat these steps 5-10 times: 1. Lie on your back on a firm bed or the floor with your legs extended. 2. Bend your knees so they are pointing toward the ceiling and your feet are flat on the floor. 3. Tighten your lower abdominal muscles  to press your lower back against the floor. This motion will tilt your pelvis so your tailbone points up toward the ceiling instead of pointing to your feet or the floor. 4. With gentle tension and even breathing, hold this position for 5-10 seconds. Cat-Cow Repeat these steps until your lower back becomes more flexible: 1. Get into a hands-and-knees position on a firm surface. Keep your hands under your shoulders, and keep your knees under your hips. You may place padding under your knees for comfort. 2. Let your head hang down, and point your tailbone toward the floor so your lower back becomes rounded like the back of a cat. 3. Hold this position for 5 seconds. 4. Slowly lift your head and point your tailbone up toward the ceiling so your back forms a sagging arch like the back of a cow. 5. Hold this position for 5 seconds. Press-Ups Repeat these steps 5-10 times: 1. Lie on your abdomen (face-down) on the floor. 2. Place your palms near your head, about shoulder-width apart. 3. While you keep your back as relaxed as possible and keep your hips on the floor, slowly straighten your arms to raise the top half of your body and lift your shoulders. Do not use your back muscles to raise your upper torso. You may adjust the placement of your hands to make yourself more comfortable. 4. Hold this position for 5 seconds while you keep  your back relaxed. 5. Slowly return to lying flat on the floor. Bridges Repeat these steps 10 times: 1. Lie on your back on a firm surface. 2. Bend your knees so they are pointing toward the ceiling and your feet are flat on the floor. 3. Tighten your buttocks muscles and lift your buttocks off of the floor until your waist is at almost the same height as your knees. You should feel the muscles working in your buttocks and the back of your thighs. If you do not feel these muscles, slide your feet 1-2 inches farther away from your buttocks. 4. Hold this position for 3-5  seconds. 5. Slowly lower your hips to the starting position, and allow your buttocks muscles to relax completely. If this exercise is too easy, try doing it with your arms crossed over your chest. Abdominal Crunches Repeat these steps 5-10 times: 1. Lie on your back on a firm bed or the floor with your legs extended. 2. Bend your knees so they are pointing toward the ceiling and your feet are flat on the floor. 3. Cross your arms over your chest. 4. Tip your chin slightly toward your chest without bending your neck. 5. Tighten your abdominal muscles and slowly raise your trunk (torso) high enough to lift your shoulder blades a tiny bit off of the floor. Avoid raising your torso higher than that, because it can put too much stress on your low back and it does not help to strengthen your abdominal muscles. 6. Slowly return to your starting position. Back Lifts Repeat these steps 5-10 times: 1. Lie on your abdomen (face-down) with your arms at your sides, and rest your forehead on the floor. 2. Tighten the muscles in your legs and your buttocks. 3. Slowly lift your chest off of the floor while you keep your hips pressed to the floor. Keep the back of your head in line with the curve in your back. Your eyes should be looking at the floor. 4. Hold this position for 3-5 seconds. 5. Slowly return to your starting position. SEEK MEDICAL CARE IF:  Your back pain or discomfort gets much worse when you do an exercise.  Your back pain or discomfort does not lessen within 2 hours after you exercise. If you have any of these problems, stop doing these exercises right away. Do not do them again unless your health care provider says that you can. SEEK IMMEDIATE MEDICAL CARE IF:  You develop sudden, severe back pain. If this happens, stop doing the exercises right away. Do not do them again unless your health care provider says that you can.   This information is not intended to replace advice given to  you by your health care provider. Make sure you discuss any questions you have with your health care provider.   Document Released: 08/15/2004 Document Revised: 03/29/2015 Document Reviewed: 09/01/2014 Elsevier Interactive Patient Education [employer].

## 2016-01-29 NOTE — Progress Notes (Signed)
BP 110/76   Pulse 76   Temp 98.2 F (36.8 C) (Oral)   Resp 14   Wt 180 lb (81.6 kg)   SpO2 97%   BMI 32.92 kg/m    Subjective:    Patient ID: Michaela Brown, female    DOB: 1965-12-20, 50 y.o.   MRN: [id number]  HPI: Michaela Brown is a 50 y.o. female  Chief Complaint  Patient presents with  . Follow-up  . Medication Refill  . Back Pain  . Insect Bite    right below the elbow   Doing well on the medicine; taking prozac and no ill effects; no side effects  Feels much better; doing great physically, moving, active  She hurt her back last Friday (10 days ago); finishing the trailer; fracture of transverse processes at L3 and L4 a long time ago; tweaked it and then performed multiple shows a day; just took aleve and drove back, long car trips, lots of stuff to unpack and pack; wears a neoprene brace around the back; wears it when driving; totally helps; right side and left side and pulling; muscles in the front even were painful and hard and inflamed; she did not get US done, plans to do in a few more weeks; gone for the whole summer after this; will do the colonoscopy later too  She has pain in the RLQ too from lifting, no hx of hernia; no fam hx of hernia; celebrex works well; not allergic to naproxen; not really having much heartburn; did have stomach ulcers 20 years ago; from medicine for knees  She did not start vitamin D  She has high cholesterol; trying to eat better; high chol runs in the family; even small sister had issues; brother too; not much fried foods; gets a wrap or salad; not much cheese; daily peanut butter  Bump on the right arm; right-handed; bruised up; not too itchy; not necrotic; no fevers  Depression screen [facility] 2/9 12/25/2015 06/23/2015  Decreased Interest 0 1  Down, Depressed, Hopeless 1 3  PHQ - 2 Score 1 4  Altered sleeping - 3  Tired, decreased energy - 3  Change in appetite - 0  Feeling bad or failure about yourself  - 3  Trouble  concentrating - 3  Moving slowly or fidgety/restless - 0  Suicidal thoughts - 1  PHQ-9 Score - 17   Relevant past medical, surgical, family and social history reviewed Past Medical History:  Diagnosis Date  . Depression   . Hyperlipidemia    History reviewed. No pertinent surgical history. Family History  Problem Relation Age of Onset  . Neurologic Disorder Brother   . Depression Brother    Social History  Substance Use Topics  . Smoking status: Never Smoker  . Smokeless tobacco: Not on file  . Alcohol use 0.0 oz/week     Comment: rarely   Interim medical history since last visit reviewed. Allergies and medications reviewed  Review of Systems Per HPI unless specifically indicated above     Objective:    BP 110/76   Pulse 76   Temp 98.2 F (36.8 C) (Oral)   Resp 14   Wt 180 lb (81.6 kg)   SpO2 97%   BMI 32.92 kg/m   Wt Readings from Last 3 Encounters:  01/29/16 180 lb (81.6 kg)  12/25/15 179 lb (81.2 kg)  06/23/15 173 lb 8 oz (78.7 kg)    Physical Exam  Constitutional: She appears well-developed and well-nourished. No distress.  Eyes: EOM are normal. No scleral icterus.  Neck: No thyromegaly present.  Cardiovascular: Normal rate.   Pulmonary/Chest: Effort normal.  Abdominal: Soft. She exhibits no distension and no mass.  Neurological: She is alert.  Skin: No pallor.  Psychiatric: She has a normal mood and affect. Her behavior is normal. Judgment and thought content normal. Her mood appears not anxious. She does not exhibit a depressed mood.   Results for orders placed or performed in visit on 12/25/15  GC/Chlamydia Probe Amp  Result Value Ref Range   Chlamydia trachomatis, NAA Negative Negative   Neisseria gonorrhoeae by PCR Negative Negative  CBC with Differential/Platelet  Result Value Ref Range   WBC 8.5 3.4 - 10.8 x10E3/uL   RBC 4.90 3.77 - 5.28 x10E6/uL   Hemoglobin 14.4 11.1 - 15.9 g/dL   Hematocrit 40.9 34.0 - 46.6 %   MCV 84 79 - 97 fL    MCH 29.4 26.6 - 33.0 pg   MCHC 35.2 31.5 - 35.7 g/dL   RDW 14.1 12.3 - 15.4 %   Platelets 277 150 - 379 x10E3/uL   Neutrophils 61 %   Lymphs 30 %   Monocytes 7 %   Eos 2 %   Basos 0 %   Neutrophils Absolute 5.2 1.4 - 7.0 x10E3/uL   Lymphocytes Absolute 2.6 0.7 - 3.1 x10E3/uL   Monocytes Absolute 0.6 0.1 - 0.9 x10E3/uL   EOS (ABSOLUTE) 0.2 0.0 - 0.4 x10E3/uL   Basophils Absolute 0.0 0.0 - 0.2 x10E3/uL   Immature Granulocytes 0 %   Immature Grans (Abs) 0.0 0.0 - 0.1 x10E3/uL  ANA w/Reflex if Positive  Result Value Ref Range   Anit Nuclear Antibody(ANA) Negative Negative  Comprehensive metabolic panel  Result Value Ref Range   Glucose 123 (H) 65 - 99 mg/dL   BUN 14 6 - 24 mg/dL   Creatinine, Ser 0.73 0.57 - 1.00 mg/dL   GFR calc non Af Amer 98 >59 mL/min/1.73   GFR calc Af Amer 113 >59 mL/min/1.73   BUN/Creatinine Ratio 19 9 - 23   Sodium 142 134 - 144 mmol/L   Potassium 3.9 3.5 - 5.2 mmol/L   Chloride 102 96 - 106 mmol/L   CO2 20 18 - 29 mmol/L   Calcium 10.0 8.7 - 10.2 mg/dL   Total Protein 7.2 6.0 - 8.5 g/dL   Albumin 4.3 3.5 - 5.5 g/dL   Globulin, Total 2.9 1.5 - 4.5 g/dL   Albumin/Globulin Ratio 1.5 1.2 - 2.2   Bilirubin Total 0.3 0.0 - 1.2 mg/dL   Alkaline Phosphatase 96 39 - 117 IU/L   AST 19 0 - 40 IU/L   ALT 16 0 - 32 IU/L  Lipid Panel w/o Chol/HDL Ratio  Result Value Ref Range   Cholesterol, Total 256 (H) 100 - 199 mg/dL   Triglycerides 311 (H) 0 - 149 mg/dL   HDL 52 >39 mg/dL   VLDL Cholesterol Cal 62 (H) 5 - 40 mg/dL   LDL Calculated 142 (H) 0 - 99 mg/dL  T4, free  Result Value Ref Range   Free T4 1.11 0.82 - 1.77 ng/dL  TSH  Result Value Ref Range   TSH 1.040 0.450 - 4.500 uIU/mL  VITAMIN D 25 Hydroxy (Vit-D Deficiency, Fractures)  Result Value Ref Range   Vit D, 25-Hydroxy 30.6 30.0 - 100.0 ng/mL  Vitamin B12  Result Value Ref Range   Vitamin B-12 564 211 - 946 pg/mL  HIV antibody  Result Value Ref Range  HIV Screen 4th Generation wRfx Non  Reactive Non Reactive      Assessment & Plan:   Problem List Items Addressed This Visit      Other   Major depressive disorder, single episode with anxious distress - Primary    Doing well on medicine; continue same; call if any problems      Relevant Medications   FLUoxetine (PROZAC) 20 MG capsule   Hyperlipidemia LDL goal <100    Encouraged healthy eating; likely inherited; recheck fasting after 3 months of TLC      Hx of gastric ulcer    Hx of gastric ulcer on NSAIDs; will use celebrex PRN; avoid other NSAIDs       Other Visit Diagnoses    Low back strain, subsequent encounter       symptomatic care, conservative treatment; to physical therapy if needed; muscle relaxant if needed      Follow up plan: Return in about 6 months (around 07/31/2016) for fasting labs and visit for cholesterol and sugar (okay to have labs done 2-3 days before).  An after-visit summary was printed and given to the patient at [place].  Please see the patient instructions which may contain other information and recommendations beyond what is mentioned above in the assessment and plan.  Meds ordered this encounter  Medications  . FLUoxetine (PROZAC) 20 MG capsule    Sig: Take 1 capsule (20 mg total) by mouth daily. (this replaces citalopram)    Dispense:  90 capsule    Refill:  1    Stop the citalopram  . celecoxib (CELEBREX) 100 MG capsule    Sig: Take 1 capsule (100 mg total) by mouth 2 (two) times daily.    Dispense:  30 capsule    Refill:  2    Hx NSAID-induced stomach ulcer  . cyclobenzaprine (FLEXERIL) 10 MG tablet    Sig: Take 0.5-1 tablets (5-10 mg total) by mouth every 8 (eight) hours as needed for muscle spasms.    Dispense:  30 tablet    Refill:  1

## 2016-02-04 DIAGNOSIS — Z0000 Encounter for general adult medical examination without abnormal findings: Secondary | ICD-10-CM | POA: Insufficient documentation

## 2016-02-04 NOTE — Assessment & Plan Note (Signed)
USPSTF grade A and B recommendations reviewed with patient; age-appropriate recommendations, preventive care, screening tests, etc discussed and encouraged; healthy living encouraged; see AVS for patient education given to patient  

## 2016-02-04 NOTE — Assessment & Plan Note (Signed)
Ordering US 

## 2016-02-04 NOTE — Assessment & Plan Note (Signed)
Check lipid panel  

## 2016-02-04 NOTE — Assessment & Plan Note (Signed)
Supportive listening; start SSRI; close f/u; call before next visit if any dark thoughts

## 2016-02-04 NOTE — Assessment & Plan Note (Signed)
Reported to be hemorrhoids; would prefer patient see GI given her age (late 6s)

## 2016-04-08 DIAGNOSIS — Z8711 Personal history of peptic ulcer disease: Secondary | ICD-10-CM | POA: Insufficient documentation

## 2016-04-08 DIAGNOSIS — Z8719 Personal history of other diseases of the digestive system: Secondary | ICD-10-CM | POA: Insufficient documentation

## 2016-04-08 NOTE — Assessment & Plan Note (Signed)
Encouraged healthy eating; likely inherited; recheck fasting after 3 months of TLC

## 2016-04-08 NOTE — Assessment & Plan Note (Signed)
Hx of gastric ulcer on NSAIDs; will use celebrex PRN; avoid other NSAIDs

## 2016-04-08 NOTE — Assessment & Plan Note (Signed)
Doing well on medicine; continue same; call if any problems

## 2016-06-19 ENCOUNTER — Ambulatory Visit (INDEPENDENT_AMBULATORY_CARE_PROVIDER_SITE_OTHER): Payer: BLUE CROSS/BLUE SHIELD | Admitting: Family Medicine

## 2016-06-19 ENCOUNTER — Encounter: Payer: Self-pay | Admitting: Family Medicine

## 2016-06-19 VITALS — BP 108/70 | HR 77 | Temp 98.9°F | Resp 14 | Ht 62.0 in | Wt 178.3 lb

## 2016-06-19 DIAGNOSIS — D485 Neoplasm of uncertain behavior of skin: Secondary | ICD-10-CM | POA: Diagnosis not present

## 2016-06-19 DIAGNOSIS — W57XXXA Bitten or stung by nonvenomous insect and other nonvenomous arthropods, initial encounter: Secondary | ICD-10-CM | POA: Diagnosis not present

## 2016-06-19 DIAGNOSIS — B070 Plantar wart: Secondary | ICD-10-CM

## 2016-06-19 NOTE — Assessment & Plan Note (Signed)
Refer to derm for evaluation and excision; ddx includes keratoacanthoma, SCC, foreign body

## 2016-06-19 NOTE — Assessment & Plan Note (Signed)
Try Duofilm; will also have derm see her for the leg

## 2016-06-19 NOTE — Patient Instructions (Signed)
Try Duofilm; apply daily to the wart; cover; should dissolve over 4 weeks or so Let me of any symptoms that suggest tick-borne illness

## 2016-06-19 NOTE — Progress Notes (Signed)
BP 108/70 (BP Location: Left Arm, Patient Position: Sitting, Cuff Size: Normal)   Pulse 77   Temp 98.9 F (37.2 C) (Oral)   Resp 14   Ht 5\' 2"  (1.575 m)   Wt 178 lb 5 oz (80.9 kg)   SpO2 98%   BMI 32.61 kg/m    Subjective:    Patient ID: Michaela Brown, female    DOB: May 18, 1966, 50 y.o.   MRN: [id number]  HPI: Michaela Brown is a 50 y.o. female  Chief Complaint  Patient presents with  . Mass    Poss wart on right leg and left foot    She has a place on the medial posterior RIGHT leg; been there for a month or two; got angry and red and crusted; getting bigger; not sure if bad sunburn there in the past; really painful to just bump; has not done anything to it; no hx of skin cancer; no hx of skin cancer in family  Wart on bottom of the left foot, ball of the left foot; present for years; tried the little pads just recently  Deer tick pulled off of right distal thigh last week; in [place]; had a headache, but before the tick; no fevers or aches; had swelling in her knee and ankle a year and a half almost two years ago; had Lyme disease testing, had fluid pulled out of her knees, tested for parvo virus; saw rheum and infectious disease; ID thought early stages of autoimmune disease  Depression screen [facility] 2/9 06/19/2016 12/25/2015 06/23/2015  Decreased Interest 0 0 1  Down, Depressed, Hopeless 0 1 3  PHQ - 2 Score 0 1 4  Altered sleeping - - 3  Tired, decreased energy - - 3  Change in appetite - - 0  Feeling bad or failure about yourself  - - 3  Trouble concentrating - - 3  Moving slowly or fidgety/restless - - 0  Suicidal thoughts - - 1  PHQ-9 Score - - 17   Relevant past medical, surgical, family and social history reviewed Past Medical History:  Diagnosis Date  . Depression   . Hyperlipidemia    History reviewed. No pertinent surgical history.   Family History  Problem Relation Age of Onset  . Neurologic Disorder Brother   . Depression Brother    Social  History  Substance Use Topics  . Smoking status: Never Smoker  . Smokeless tobacco: Not on file  . Alcohol use 0.0 oz/week     Comment: rarely   Interim medical history since last visit reviewed. Allergies and medications reviewed  Review of Systems Per HPI unless specifically indicated above     Objective:    BP 108/70 (BP Location: Left Arm, Patient Position: Sitting, Cuff Size: Normal)   Pulse 77   Temp 98.9 F (37.2 C) (Oral)   Resp 14   Ht 5\' 2"  (1.575 m)   Wt 178 lb 5 oz (80.9 kg)   SpO2 98%   BMI 32.61 kg/m   Wt Readings from Last 3 Encounters:  06/19/16 178 lb 5 oz (80.9 kg)  01/29/16 180 lb (81.6 kg)  12/25/15 179 lb (81.2 kg)    Physical Exam  Constitutional: She appears well-developed and well-nourished. No distress.  Cardiovascular: Normal rate.   Pulmonary/Chest: Effort normal.  Skin: Lesion (posteromedial RIGHT leg, firm erythematous lesion about 8 mm diameter with keratotic center; firm induration; no rolled border) noted. No rash noted.  Verrucous lesion ball of LEFT foot,  plantar aspect  Psychiatric: She has a normal mood and affect.   Results for orders placed or performed in visit on 12/25/15  GC/Chlamydia Probe Amp  Result Value Ref Range   Chlamydia trachomatis, NAA Negative Negative   Neisseria gonorrhoeae by PCR Negative Negative  CBC with Differential/Platelet  Result Value Ref Range   WBC 8.5 3.4 - 10.8 x10E3/uL   RBC 4.90 3.77 - 5.28 x10E6/uL   Hemoglobin 14.4 11.1 - 15.9 g/dL   Hematocrit 40.9 34.0 - 46.6 %   MCV 84 79 - 97 fL   MCH 29.4 26.6 - 33.0 pg   MCHC 35.2 31.5 - 35.7 g/dL   RDW 14.1 12.3 - 15.4 %   Platelets 277 150 - 379 x10E3/uL   Neutrophils 61 %   Lymphs 30 %   Monocytes 7 %   Eos 2 %   Basos 0 %   Neutrophils Absolute 5.2 1.4 - 7.0 x10E3/uL   Lymphocytes Absolute 2.6 0.7 - 3.1 x10E3/uL   Monocytes Absolute 0.6 0.1 - 0.9 x10E3/uL   EOS (ABSOLUTE) 0.2 0.0 - 0.4 x10E3/uL   Basophils Absolute 0.0 0.0 - 0.2 x10E3/uL     Immature Granulocytes 0 %   Immature Grans (Abs) 0.0 0.0 - 0.1 x10E3/uL  ANA w/Reflex if Positive  Result Value Ref Range   Anit Nuclear Antibody(ANA) Negative Negative  Comprehensive metabolic panel  Result Value Ref Range   Glucose 123 (H) 65 - 99 mg/dL   BUN 14 6 - 24 mg/dL   Creatinine, Ser 0.73 0.57 - 1.00 mg/dL   GFR calc non Af Amer 98 >59 mL/min/1.73   GFR calc Af Amer 113 >59 mL/min/1.73   BUN/Creatinine Ratio 19 9 - 23   Sodium 142 134 - 144 mmol/L   Potassium 3.9 3.5 - 5.2 mmol/L   Chloride 102 96 - 106 mmol/L   CO2 20 18 - 29 mmol/L   Calcium 10.0 8.7 - 10.2 mg/dL   Total Protein 7.2 6.0 - 8.5 g/dL   Albumin 4.3 3.5 - 5.5 g/dL   Globulin, Total 2.9 1.5 - 4.5 g/dL   Albumin/Globulin Ratio 1.5 1.2 - 2.2   Bilirubin Total 0.3 0.0 - 1.2 mg/dL   Alkaline Phosphatase 96 39 - 117 IU/L   AST 19 0 - 40 IU/L   ALT 16 0 - 32 IU/L  Lipid Panel w/o Chol/HDL Ratio  Result Value Ref Range   Cholesterol, Total 256 (H) 100 - 199 mg/dL   Triglycerides 311 (H) 0 - 149 mg/dL   HDL 52 >39 mg/dL   VLDL Cholesterol Cal 62 (H) 5 - 40 mg/dL   LDL Calculated 142 (H) 0 - 99 mg/dL  T4, free  Result Value Ref Range   Free T4 1.11 0.82 - 1.77 ng/dL  TSH  Result Value Ref Range   TSH 1.040 0.450 - 4.500 uIU/mL  VITAMIN D 25 Hydroxy (Vit-D Deficiency, Fractures)  Result Value Ref Range   Vit D, 25-Hydroxy 30.6 30.0 - 100.0 ng/mL  Vitamin B12  Result Value Ref Range   Vitamin B-12 564 211 - 946 pg/mL  HIV antibody  Result Value Ref Range   HIV Screen 4th Generation wRfx Non Reactive Non Reactive      Assessment & Plan:   Problem List Items Addressed This Visit      Musculoskeletal and Integument   Verruca plantaris    Try Duofilm; will also have derm see her for the leg      Relevant Orders  Ambulatory referral to Dermatology   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  An after-visit summary was printed and given to the patient at [place].  Please see the patient instructions which may contain other information and recommendations beyond what is mentioned above in the assessment and plan.  No orders of the defined types were placed in this encounter.   Orders Placed This Encounter  Procedures  . Ambulatory referral to Dermatology

## 2016-07-31 ENCOUNTER — Ambulatory Visit: Payer: BLUE CROSS/BLUE SHIELD | Admitting: Family Medicine

## 2016-08-13 ENCOUNTER — Ambulatory Visit: Payer: BLUE CROSS/BLUE SHIELD | Admitting: Family Medicine

## 2016-09-06 ENCOUNTER — Other Ambulatory Visit: Payer: Self-pay | Admitting: Family Medicine

## 2016-09-06 MED ORDER — FLUOXETINE HCL 20 MG PO CAPS: 20.0000 mg | ORAL_CAPSULE | Freq: Every day | ORAL | 1 refills | Status: DC

## 2017-01-01 ENCOUNTER — Telehealth: Payer: Self-pay | Admitting: Family Medicine

## 2017-01-01 ENCOUNTER — Other Ambulatory Visit: Payer: Self-pay

## 2017-01-01 DIAGNOSIS — Z1239 Encounter for other screening for malignant neoplasm of breast: Secondary | ICD-10-CM

## 2017-01-01 NOTE — Telephone Encounter (Signed)
Pt has physical scheduled for July however she is asking that you please place an order for mammogram to [facility]

## 2017-01-01 NOTE — Telephone Encounter (Signed)
Order the mammogram and left a message giving the pt the number and instructions to schedule her mammogram.

## 2017-01-15 ENCOUNTER — Ambulatory Visit
Admission: RE | Admit: 2017-01-15 | Discharge: 2017-01-15 | Disposition: A | Discharge status: Home / Self Care | Payer: BLUE CROSS/BLUE SHIELD | Source: Ambulatory Visit | Attending: Family Medicine | Admitting: Family Medicine

## 2017-01-15 DIAGNOSIS — Z1231 Encounter for screening mammogram for malignant neoplasm of breast: Secondary | ICD-10-CM | POA: Diagnosis not present

## 2017-01-15 DIAGNOSIS — Z1239 Encounter for other screening for malignant neoplasm of breast: Secondary | ICD-10-CM

## 2017-01-24 ENCOUNTER — Other Ambulatory Visit: Payer: Self-pay | Admitting: *Deleted

## 2017-01-24 ENCOUNTER — Inpatient Hospital Stay
Admission: RE | Admit: 2017-01-24 | Discharge: 2017-01-24 | Disposition: A | Discharge status: Home / Self Care | Payer: Self-pay | Source: Ambulatory Visit | Attending: *Deleted | Admitting: *Deleted

## 2017-01-24 DIAGNOSIS — Z9289 Personal history of other medical treatment: Secondary | ICD-10-CM

## 2017-01-30 ENCOUNTER — Ambulatory Visit (INDEPENDENT_AMBULATORY_CARE_PROVIDER_SITE_OTHER): Payer: BLUE CROSS/BLUE SHIELD | Admitting: Family Medicine

## 2017-01-30 ENCOUNTER — Encounter: Payer: Self-pay | Admitting: Family Medicine

## 2017-01-30 DIAGNOSIS — Z0000 Encounter for general adult medical examination without abnormal findings: Secondary | ICD-10-CM | POA: Diagnosis not present

## 2017-01-30 MED ORDER — FLUOXETINE HCL 20 MG PO CAPS: 20.0000 mg | ORAL_CAPSULE | Freq: Every day | ORAL | 3 refills | Status: AC

## 2017-01-30 NOTE — Assessment & Plan Note (Signed)
USPSTF grade A and B recommendations reviewed with patient; age-appropriate recommendations, preventive care, screening tests, etc discussed and encouraged; healthy living encouraged; see AVS for patient education given to patient  

## 2017-01-30 NOTE — Patient Instructions (Addendum)
Ice is your friend Try NSAID per package directions Try turmeric as a natural anti-inflammatory (for pain and arthritis). It comes in capsules where you buy aspirin and fish oil, but also as a spice where you buy pepper and garlic powder. Return when convenient for fasting labs  Health Maintenance, Female Adopting a healthy lifestyle and getting preventive care can go a long way to promote health and wellness. Talk with your health care provider about what schedule of regular examinations is right for you. This is a good chance for you to check in with your provider about disease prevention and staying healthy. In between checkups, there are plenty of things you can do on your own. Experts have done a lot of research about which lifestyle changes and preventive measures are most likely to keep you healthy. Ask your health care provider for more information. Weight and diet Eat a healthy diet  Be sure to include plenty of vegetables, fruits, low-fat dairy products, and lean protein.  Do not eat a lot of foods high in solid fats, added sugars, or salt.  Get regular exercise. This is one of the most important things you can do for your health. ? Most adults should exercise for at least 150 minutes each week. The exercise should increase your heart rate and make you sweat (moderate-intensity exercise). ? Most adults should also do strengthening exercises at least twice a week. This is in addition to the moderate-intensity exercise.  Maintain a healthy weight  Body mass index (BMI) is a measurement that can be used to identify possible weight problems. It estimates body fat based on height and weight. Your health care provider can help determine your BMI and help you achieve or maintain a healthy weight.  For females 37 years of age and older: ? A BMI below 18.5 is considered underweight. ? A BMI of 18.5 to 24.9 is normal. ? A BMI of 25 to 29.9 is considered overweight. ? A BMI of 30 and  above is considered obese.  Watch levels of cholesterol and blood lipids  You should start having your blood tested for lipids and cholesterol at 49 years of age, then have this test every 5 years.  You may need to have your cholesterol levels checked more often if: ? Your lipid or cholesterol levels are high. ? You are older than 49 years of age. ? You are at high risk for heart disease.  Cancer screening Lung Cancer  Lung cancer screening is recommended for adults 4-18 years old who are at high risk for lung cancer because of a history of smoking.  A yearly low-dose CT scan of the lungs is recommended for people who: ? Currently smoke. ? Have quit within the past 15 years. ? Have at least a 30-pack-year history of smoking. A pack year is smoking an average of one pack of cigarettes a day for 1 year.  Yearly screening should continue until it has been 15 years since you quit.  Yearly screening should stop if you develop a health problem that would prevent you from having lung cancer treatment.  Breast Cancer  Practice breast self-awareness. This means understanding how your breasts normally appear and feel.  It also means doing regular breast self-exams. Let your health care provider know about any changes, no matter how small.  If you are in your 20s or 30s, you should have a clinical breast exam (CBE) by a health care provider every 1-3 years as part of a regular  health exam.  If you are 40 or older, have a CBE every year. Also consider having a breast X-ray (mammogram) every year.  If you have a family history of breast cancer, talk to your health care provider about genetic screening.  If you are at high risk for breast cancer, talk to your health care provider about having an MRI and a mammogram every year.  Breast cancer gene (BRCA) assessment is recommended for women who have family members with BRCA-related cancers. BRCA-related cancers  include: ? Breast. ? Ovarian. ? Tubal. ? Peritoneal cancers.  Results of the assessment will determine the need for genetic counseling and BRCA1 and BRCA2 testing.  Cervical Cancer Your health care provider may recommend that you be screened regularly for cancer of the pelvic organs (ovaries, uterus, and vagina). This screening involves a pelvic examination, including checking for microscopic changes to the surface of your cervix (Pap test). You may be encouraged to have this screening done every 3 years, beginning at age 21.  For women ages 30-65, health care providers may recommend pelvic exams and Pap testing every 3 years, or they may recommend the Pap and pelvic exam, combined with testing for human papilloma virus (HPV), every 5 years. Some types of HPV increase your risk of cervical cancer. Testing for HPV may also be done on women of any age with unclear Pap test results.  Other health care providers may not recommend any screening for nonpregnant women who are considered low risk for pelvic cancer and who do not have symptoms. Ask your health care provider if a screening pelvic exam is right for you.  If you have had past treatment for cervical cancer or a condition that could lead to cancer, you need Pap tests and screening for cancer for at least 20 years after your treatment. If Pap tests have been discontinued, your risk factors (such as having a new sexual partner) need to be reassessed to determine if screening should resume. Some women have medical problems that increase the chance of getting cervical cancer. In these cases, your health care provider may recommend more frequent screening and Pap tests.  Colorectal Cancer  This type of cancer can be detected and often prevented.  Routine colorectal cancer screening usually begins at 50 years of age and continues through 49 years of age.  Your health care provider may recommend screening at an earlier age if you have risk factors  for colon cancer.  Your health care provider may also recommend using home test kits to check for hidden blood in the stool.  A small camera at the end of a tube can be used to examine your colon directly (sigmoidoscopy or colonoscopy). This is done to check for the earliest forms of colorectal cancer.  Routine screening usually begins at age 50.  Direct examination of the colon should be repeated every 5-10 years through 49 years of age. However, you may need to be screened more often if early forms of precancerous polyps or small growths are found.  Skin Cancer  Check your skin from head to toe regularly.  Tell your health care provider about any new moles or changes in moles, especially if there is a change in a mole's shape or color.  Also tell your health care provider if you have a mole that is larger than the size of a pencil eraser.  Always use sunscreen. Apply sunscreen liberally and repeatedly throughout the day.  Protect yourself by wearing long sleeves, pants, a   wide-brimmed hat, and sunglasses whenever you are outside.  Heart disease, diabetes, and high blood pressure  High blood pressure causes heart disease and increases the risk of stroke. High blood pressure is more likely to develop in: ? People who have blood pressure in the high end of the normal range (130-139/85-89 mm Hg). ? People who are overweight or obese. ? People who are African American.  If you are 14-64 years of age, have your blood pressure checked every 3-5 years. If you are 86 years of age or older, have your blood pressure checked every year. You should have your blood pressure measured twice-once when you are at a hospital or clinic, and once when you are not at a hospital or clinic. Record the average of the two measurements. To check your blood pressure when you are not at a hospital or clinic, you can use: ? An automated blood pressure machine at a pharmacy. ? A home blood pressure monitor.  If  you are between 77 years and 56 years old, ask your health care provider if you should take aspirin to prevent strokes.  Have regular diabetes screenings. This involves taking a blood sample to check your fasting blood sugar level. ? If you are at a normal weight and have a low risk for diabetes, have this test once every three years after 49 years of age. ? If you are overweight and have a high risk for diabetes, consider being tested at a younger age or more often. Preventing infection Hepatitis B  If you have a higher risk for hepatitis B, you should be screened for this virus. You are considered at high risk for hepatitis B if: ? You were born in a country where hepatitis B is common. Ask your health care provider which countries are considered high risk. ? Your parents were born in a high-risk country, and you have not been immunized against hepatitis B (hepatitis B vaccine). ? You have HIV or AIDS. ? You use needles to inject street drugs. ? You live with someone who has hepatitis B. ? You have had sex with someone who has hepatitis B. ? You get hemodialysis treatment. ? You take certain medicines for conditions, including cancer, organ transplantation, and autoimmune conditions.  Hepatitis C  Blood testing is recommended for: ? Everyone born from [birth year] through 1963. ? Anyone with known risk factors for hepatitis C.  Sexually transmitted infections (STIs)  You should be screened for sexually transmitted infections (STIs) including gonorrhea and chlamydia if: ? You are sexually active and are younger than 49 years of age. ? You are older than 49 years of age and your health care provider tells you that you are at risk for this type of infection. ? Your sexual activity has changed since you were last screened and you are at an increased risk for chlamydia or gonorrhea. Ask your health care provider if you are at risk.  If you do not have HIV, but are at risk, it may be recommended  that you take a prescription medicine daily to prevent HIV infection. This is called pre-exposure prophylaxis (PrEP). You are considered at risk if: ? You are sexually active and do not regularly use condoms or know the HIV status of your partner(s). ? You take drugs by injection. ? You are sexually active with a partner who has HIV.  Talk with your health care provider about whether you are at high risk of being infected with HIV. If you choose to  begin PrEP, you should first be tested for HIV. You should then be tested every 3 months for as long as you are taking PrEP. Pregnancy  If you are premenopausal and you may become pregnant, ask your health care provider about preconception counseling.  If you may become pregnant, take 400 to 800 micrograms (mcg) of folic acid every day.  If you want to prevent pregnancy, talk to your health care provider about birth control (contraception). Osteoporosis and menopause  Osteoporosis is a disease in which the bones lose minerals and strength with aging. This can result in serious bone fractures. Your risk for osteoporosis can be identified using a bone density scan.  If you are 17 years of age or older, or if you are at risk for osteoporosis and fractures, ask your health care provider if you should be screened.  Ask your health care provider whether you should take a calcium or vitamin D supplement to lower your risk for osteoporosis.  Menopause may have certain physical symptoms and risks.  Hormone replacement therapy may reduce some of these symptoms and risks. Talk to your health care provider about whether hormone replacement therapy is right for you. Follow these instructions at home:  Schedule regular health, dental, and eye exams.  Stay current with your immunizations.  Do not use any tobacco products including cigarettes, chewing tobacco, or electronic cigarettes.  If you are pregnant, do not drink alcohol.  If you are  breastfeeding, limit how much and how often you drink alcohol.  Limit alcohol intake to no more than 1 drink per day for nonpregnant women. One drink equals 12 ounces of beer, 5 ounces of wine, or 1 ounces of hard liquor.  Do not use street drugs.  Do not share needles.  Ask your health care provider for help if you need support or information about quitting drugs.  Tell your health care provider if you often feel depressed.  Tell your health care provider if you have ever been abused or do not feel safe at home. This information is not intended to replace advice given to you by your health care provider. Make sure you discuss any questions you have with your health care provider. Document Released: 01/21/2011 Document Revised: 12/14/2015 Document Reviewed: 04/11/2015 Elsevier Interactive Patient Education  [employer].

## 2017-01-30 NOTE — Progress Notes (Signed)
Patient ID: Michaela Brown, female   DOB: 1966/07/16, 51 y.o.   MRN: [id number]   Subjective:   Michaela Brown is a 51 y.o. female here for a complete physical exam  Interim issues since last visit: no medical excitement Heel hurts; left foot; may be plantar fasciitis; pain more in the posterior aspect; hurts, but not first thing in the AM; has inserts; burning; had a heel spur; has not tried ice, NSAIDs, turmeric  USPSTF grade A and B recommendations Depression:  Depression screen [facility] 2/9 01/30/2017 06/19/2016 12/25/2015 06/23/2015  Decreased Interest 0 0 0 1  Down, Depressed, Hopeless 0 0 1 3  PHQ - 2 Score 0 0 1 4  Altered sleeping - - - 3  Tired, decreased energy - - - 3  Change in appetite - - - 0  Feeling bad or failure about yourself  - - - 3  Trouble concentrating - - - 3  Moving slowly or fidgety/restless - - - 0  Suicidal thoughts - - - 1  PHQ-9 Score - - - 17   Hypertension: BP Readings from Last 3 Encounters:  01/30/17 122/66  06/19/16 108/70  01/29/16 110/76   Obesity: up and down, food dependent Wt Readings from Last 3 Encounters:  01/30/17 177 lb 3.2 oz (80.4 kg)  06/19/16 178 lb 5 oz (80.9 kg)  01/29/16 180 lb (81.6 kg)   BMI Readings from Last 3 Encounters:  01/30/17 31.89 kg/m  06/19/16 32.61 kg/m  01/29/16 32.92 kg/m    Alcohol: under Tobacco use: never  HIV, hep B, hep C: would like to have tested STD testing and prevention (chl/gon/syphilis): test today; new boyfriend; no sx Intimate partner violence: no abuse Breast cancer: no lumps BRCA gene screening: n/a Cervical cancer screening: UTD Osteoporosis: n/a Fall prevention/vitamin D: active, taking vit D 1000 iu daily Lipids:  Not fasting today, will try dietary changes and return late August Lab Results  Component Value Date   CHOL 256 (H) 12/25/2015   Lab Results  Component Value Date   HDL 52 12/25/2015   Lab Results  Component Value Date   LDLCALC 142 (H) 12/25/2015   Lab  Results  Component Value Date   TRIG 311 (H) 12/25/2015   No results found for: CHOLHDL No results found for: LDLDIRECT Glucose:  Glucose  Date Value Ref Range Status  12/25/2015 123 (H) 65 - 99 mg/dL Final   Colorectal cancer: start age 60 per current guidelines; ACS says 83 but discussed other groups; just occasional hemorrhoidal bleeding; did not get earlier colonoscopy ($$), did have one 10 years ago Lung cancer:  n/a AAA: n/a Aspirin: n/a Diet: will adjust diet on the road Exercise: active Skin cancer: nothing worrisome, saw derm   Past Medical History:  Diagnosis Date  . Depression   . Hyperlipidemia    No past surgical history on file. Family History  Problem Relation Age of Onset  . Neurologic Disorder Brother   . Depression Brother   . Cancer Mother        stomach  . COPD Father   . Stroke Maternal Grandfather   . Breast cancer Neg Hx    Social History  Substance Use Topics  . Smoking status: Never Smoker  . Smokeless tobacco: Never Used  . Alcohol use 0.0 oz/week     Comment: rarely   Review of Systems  Objective:   Vitals:   01/30/17 1407  BP: 122/66  Pulse: 96  Resp: 16  Temp: 98.1 F (36.7 C)  TempSrc: Oral  SpO2: 97%  Weight: 177 lb 3.2 oz (80.4 kg)  Height: 5' 2.5" (1.588 m)   Body mass index is 31.89 kg/m. Wt Readings from Last 3 Encounters:  01/30/17 177 lb 3.2 oz (80.4 kg)  06/19/16 178 lb 5 oz (80.9 kg)  01/29/16 180 lb (81.6 kg)   Physical Exam  Constitutional: She appears well-developed and well-nourished.  HENT:  Head: Normocephalic and atraumatic.  Right Ear: Hearing, tympanic membrane, external ear and ear canal normal.  Left Ear: Hearing, tympanic membrane, external ear and ear canal normal.  Eyes: Conjunctivae and EOM are normal. Right eye exhibits no hordeolum. Left eye exhibits no hordeolum. No scleral icterus.  Neck: Carotid bruit is not present. No thyromegaly present.  Cardiovascular: Normal rate, regular rhythm,  S1 normal, S2 normal and normal heart sounds.   No extrasystoles are present.  Pulmonary/Chest: Effort normal and breath sounds normal. No respiratory distress. Right breast exhibits no inverted nipple, no mass, no nipple discharge, no skin change and no tenderness. Left breast exhibits no inverted nipple, no mass, no nipple discharge, no skin change and no tenderness. Breasts are symmetrical.  Abdominal: Soft. Normal appearance and bowel sounds are normal. She exhibits no distension, no abdominal bruit, no pulsatile midline mass and no mass. There is no hepatosplenomegaly. There is no tenderness. No hernia.  Musculoskeletal: Normal range of motion. She exhibits no edema.  Lymphadenopathy:       Head (right side): No submandibular adenopathy present.       Head (left side): No submandibular adenopathy present.    She has no cervical adenopathy.    She has no axillary adenopathy.  Neurological: She is alert. She displays no tremor. No cranial nerve deficit. She exhibits normal muscle tone. Gait normal.  Reflex Scores:      Patellar reflexes are 2+ on the right side and 2+ on the left side. Skin: Skin is warm and dry. No bruising and no ecchymosis noted. No cyanosis. No pallor.  Psychiatric: Her speech is normal and behavior is normal. Thought content normal. Her mood appears not anxious. She does not exhibit a depressed mood.    Assessment/Plan:   Problem List Items Addressed This Visit      Other   Preventative health care    USPSTF grade A and B recommendations reviewed with patient; age-appropriate recommendations, preventive care, screening tests, etc discussed and encouraged; healthy living encouraged; see AVS for patient education given to patient       Relevant Orders   CBC with Differential/Platelet   COMPLETE METABOLIC PANEL WITH GFR   Lipid panel   TSH   HIV antibody (with reflex) (Completed)   RPR (Completed)   Hepatitis B Surface AntiGEN (Completed)   GC/Chlamydia Probe  Amp (Completed)       Meds ordered this encounter  Medications  . FLUoxetine (PROZAC) 20 MG capsule    Sig: Take 1 capsule (20 mg total) by mouth daily. (this replaces citalopram)    Dispense:  90 capsule    Refill:  3    Stop the citalopram   Orders Placed This Encounter  Procedures  . GC/Chlamydia Probe Amp  . CBC with Differential/Platelet  . COMPLETE METABOLIC PANEL WITH GFR  . Lipid panel  . TSH  . HIV antibody (with reflex)  . RPR  . Hepatitis B Surface AntiGEN    Follow up plan: Return in about 1 year (around 01/30/2018) for complete physical.  An  After Visit Summary was printed and given to the patient.

## 2017-01-31 LAB — HEPATITIS B SURFACE ANTIGEN: Hepatitis B Surface Ag: NEGATIVE

## 2017-01-31 LAB — RPR

## 2017-01-31 LAB — GC/CHLAMYDIA PROBE AMP
CT Probe RNA: NOT DETECTED
GC PROBE AMP APTIMA: NOT DETECTED

## 2017-01-31 LAB — HIV ANTIBODY (ROUTINE TESTING W REFLEX): HIV 1&2 Ab, 4th Generation: NONREACTIVE

## 2017-03-20 ENCOUNTER — Telehealth: Payer: Self-pay | Admitting: Family Medicine

## 2017-03-20 NOTE — Telephone Encounter (Signed)
Please ask patient to have the fasting labs done that were ordered at her physical; thank you

## 2017-03-21 NOTE — Telephone Encounter (Signed)
Left a detail message

## 2017-09-06 NOTE — Progress Notes (Signed)
Closing out lab/order note open since:  June 2018
